# Patient Record
Sex: Male | Born: 1985 | Race: White | Hispanic: No | Marital: Married | Smoking: Never smoker
Health system: Southern US, Community
[De-identification: ages and names within clinical notes are randomized; demographics above are authoritative.]

---
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
## 2009-07-13 ENCOUNTER — Encounter: Payer: Self-pay | Admitting: Family Medicine

## 2009-07-13 DIAGNOSIS — R079 Chest pain, unspecified: Secondary | ICD-10-CM

## 2009-07-14 ENCOUNTER — Encounter: Payer: Self-pay | Admitting: Family Medicine

## 2009-07-14 ENCOUNTER — Ambulatory Visit: Payer: Self-pay | Admitting: Family Medicine

## 2009-07-14 ENCOUNTER — Ambulatory Visit (HOSPITAL_COMMUNITY): Admission: RE | Admit: 2009-07-14 | Discharge: 2009-07-14 | Payer: Self-pay | Admitting: Family Medicine

## 2009-07-14 DIAGNOSIS — E669 Obesity, unspecified: Secondary | ICD-10-CM

## 2009-07-14 DIAGNOSIS — F17200 Nicotine dependence, unspecified, uncomplicated: Secondary | ICD-10-CM

## 2009-07-14 DIAGNOSIS — R0602 Shortness of breath: Secondary | ICD-10-CM

## 2009-07-14 LAB — CONVERTED CEMR LAB
Albumin: 4.3 g/dL (ref 3.5–5.2)
Alkaline Phosphatase: 89 units/L (ref 39–117)
BUN: 9 mg/dL (ref 6–23)
Creatinine, Ser: 0.92 mg/dL (ref 0.40–1.50)
Glucose, Bld: 93 mg/dL (ref 70–99)
HDL: 37 mg/dL — ABNORMAL LOW (ref >39)
LDL Cholesterol: 120 mg/dL — ABNORMAL HIGH (ref 0–99)
Total Bilirubin: 0.2 mg/dL — ABNORMAL LOW (ref 0.3–1.2)
Total CHOL/HDL Ratio: 5.2
Triglycerides: 171 mg/dL — ABNORMAL HIGH (ref <150)
VLDL: 34 mg/dL (ref 0–40)

## 2009-07-16 ENCOUNTER — Encounter: Payer: Self-pay | Admitting: Family Medicine

## 2009-07-22 ENCOUNTER — Ambulatory Visit (HOSPITAL_COMMUNITY): Admission: RE | Admit: 2009-07-22 | Discharge: 2009-07-22 | Payer: Self-pay | Admitting: Family Medicine

## 2009-07-22 ENCOUNTER — Encounter: Payer: Self-pay | Admitting: Family Medicine

## 2009-07-30 ENCOUNTER — Ambulatory Visit: Payer: Self-pay | Admitting: Family Medicine

## 2009-08-03 ENCOUNTER — Encounter: Payer: Self-pay | Admitting: *Deleted

## 2009-12-06 ENCOUNTER — Encounter: Payer: Self-pay | Admitting: Family Medicine

## 2010-02-12 ENCOUNTER — Emergency Department (HOSPITAL_COMMUNITY)
Admission: EM | Admit: 2010-02-12 | Discharge: 2010-02-12 | Payer: Self-pay | Source: Home / Self Care | Admitting: Emergency Medicine

## 2010-03-22 NOTE — Letter (Signed)
Summary: Generic Letter  [facility]  [address]   [place], [place] [address]   Phone: [phone]  Fax: [phone]    08/03/2009  LAMBROS CERRO [address] [place], [place]  [address]  Dear Mr. CROCHET,      I have been unable to contact you by phone. Your doctor, Dr. Constance Goltz,  has requested we schedule an appointment for you with a cardiologist . An appointment has been scheduled for August 18, 2009 at 11:00 AM with Dr. Deborah Chalk of [facility]iology . The address is [address], [address]  phone # [phone]. If this time is not convenient, please call their office to reschedule.     Thank you. If you have any questions you can call me at [phone].      Sincerely,   Theresia Lo RN  Appended Document: Generic Letter Called home number no answer Will send another letter

## 2010-03-22 NOTE — Letter (Signed)
Summary: FU cardiology [facility]  [address]   [place], [place] [address]   Phone: [phone]  Fax: [phone]    12/06/2009  Scott Frey [address] [place], [place]  [address]  Dear [name],  This letter is a follow up on your visit with Dr Constance Goltz for chest pain.  We made an appointment for you with a cardiologist.   Please call us and let us know how you are and if you saw the cardiologist.  If you have not you should come in for an office visit to see [place].  This is very important since you could have a problem with your heart.   Sincerely,   Pearlean Brownie MD  Appended Document: FU cardiology referra mailed

## 2010-03-22 NOTE — Letter (Signed)
Summary: Lipid Letter  [facility]  [address]   [place], [place] [address]   Phone: [phone]  Fax: [phone]    07/14/2009  Ellington Greenslade [address] [place], [place]  [address]  Dear Jomarie Longs:  We have carefully reviewed your last lipid profile from 07/14/2009 and the results are noted below with a summary of recommendations for lipid management.    Cholesterol:       191     Goal: <200   HDL "good" Cholesterol:   37     Goal: >40   LDL "bad" Cholesterol:   120     Goal: <160   Triglycerides:       171     Goal: <150  Following are the results of your recent test(s):    Electrolytes -- normal   Blood Sugar -- normal   Kidney Function -- normal   Liver Function -- normal  Please call my office if you have any questions.  Regards, Madlyn Frankel. Constance Goltz, MD  Appended Document: Lipid Letter mailed.
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
## 2010-03-22 NOTE — Miscellaneous (Signed)
Summary: Health History Form     Past History:  Past Surgical History: Negative.   Current Medications (verified): 1)  None  Allergies (verified): No Known Drug Allergies   Family History: Negative for cancer and heart disease in 1st degree relatives. Positive for depresion, anxiety attacks Father alive, age 23 Mother alive, age 30  Social History: Lives in [place] with wife Jacksen Isip, b [place]) and daughter Edem Tiegs, b 2011).  Enjoys his baby and dog (pit bull).  Plays video games and enjoys yardwork.  College education.  Unemployed.  Smokes cigarettes.  No EtOH or illicit drug use.

## 2010-03-22 NOTE — Assessment & Plan Note (Signed)
Summary: F/U CHEST PAIN/KH   Vital Signs:  Patient profile:   25 year old male Height:      70 inches Weight:      262 pounds BMI:     37.73 Temp:     99.0 degrees F oral Pulse rate:   88 / minute BP sitting:   139 / 83  (left arm) Cuff size:   large  Vitals Entered By: Tessie Fass CMA (July 30, 2009 1:59 PM) CC: F/U Chest pain Is Patient Diabetic? No Pain Assessment Patient in pain? no        Primary Care Provider:  Madlyn Frankel. Constance Goltz, MD  CC:  F/U Chest pain.  History of Present Illness: 25 yo male here to follow up 2 issues:  CHEST PAIN (ICD-786.50) Awakes approximately every other day with chest soreness, that resolves spontaneously.  Denies current pain.  Formerly had some episodes of chest pain with exertion, but last episode was in October.  Not associated with dyspnea.  Had episodes of what sounds like presyncope as a young teenager, but none in the past 9-10 years.  EKG at last visit showed NSR with possible RBB.  2D echo showed mildly dilated LEFT atrium and RIGHT ventricle.  Normal LEVF.  DYSPNEA (ICD-786.05) Not associated temporally with chest pain.  Denies current dyspnea.  Last dyspnea was in December.  Clears quickly with quick breath.  Have referred to pharmacy clinic for PFTs but patient has had to reschedule that.  Continues to smoke.  Habits & Providers  Alcohol-Tobacco-Diet     Tobacco Status: current     Tobacco Counseling: to quit use of tobacco products     Cigarette Packs/Day: 1.0  Current Medications (verified): 1)  None  Allergies (verified): No Known Drug Allergies  Physical Exam  Additional Exam:  VITALS:  Reviewed, normal GEN: Alert & oriented, no acute distress CARDIO: Regular rate and rhythm, no murmurs/rubs/gallops, 2+ bilateral radial pulses RESP: Clear to auscultation, normal work of breathing, no retractions/accessory muscle use    Impression & Recommendations:  Problem # 1:  CHEST PAIN (ICD-786.50) Assessment  Unchanged  Unclear etiology.  May not be cardiac in nature, but given mild dilatation of 2 chambers and RBB will refer to cardiology for second opinion.  Orders: [facility]- Est Level  3 ([address]) Cardiology Referral (Cardiology)  Problem # 2:  DYSPNEA (ICD-786.05) Assessment: Improved  Question if this is asthma or COPD related.  Asymptomatic for several months now.  Given smoking history, have referred to pharmacy clinic for PFTs.  Orders: [facility]- Est Level  3 ([address])  Patient Instructions: 1)  Please schedule an appointment with Dr. Raymondo Band for lung function tests in the next 1-2 weeks.  You can do this at our front desk. 2)  For your chest pain, I have referred you to a cardiologist--we'll call with appointment. 3)  Please schedule a follow-up appointment with your new doctor in 1 month. 4)  Go immediately to the emergency room if your chest pain gets worse or changes in any way.

## 2010-05-02 LAB — DIFFERENTIAL
Basophils Absolute: 0 K/uL (ref 0.0–0.1)
Basophils Relative: 0 % (ref 0–1)
Eosinophils Absolute: 0.4 K/uL (ref 0.0–0.7)
Eosinophils Relative: 4 % (ref 0–5)
Lymphocytes Relative: 39 % (ref 12–46)
Lymphs Abs: 4.2 K/uL — ABNORMAL HIGH (ref 0.7–4.0)
Monocytes Absolute: 0.8 K/uL (ref 0.1–1.0)
Monocytes Relative: 7 % (ref 3–12)
Neutro Abs: 5.4 K/uL (ref 1.7–7.7)
Neutrophils Relative %: 50 % (ref 43–77)

## 2010-05-02 LAB — BASIC METABOLIC PANEL WITH GFR
BUN: 7 mg/dL (ref 6–23)
CO2: 23 meq/L (ref 19–32)
Calcium: 9 mg/dL (ref 8.4–10.5)
Chloride: 107 meq/L (ref 96–112)
Creatinine, Ser: 0.95 mg/dL (ref 0.4–1.5)
GFR calc Af Amer: >60 mL/min (ref >60)
GFR calc non Af Amer: >60 mL/min (ref >60)
Glucose, Bld: 101 mg/dL — ABNORMAL HIGH (ref 70–99)
Potassium: 3.8 meq/L (ref 3.5–5.1)
Sodium: 139 meq/L (ref 135–145)

## 2010-05-02 LAB — POCT CARDIAC MARKERS: Myoglobin, poc: 101 ng/mL (ref 12–200)

## 2010-05-02 LAB — URINALYSIS, ROUTINE W REFLEX MICROSCOPIC
Ketones, ur: NEGATIVE mg/dL
Nitrite: NEGATIVE
Protein, ur: NEGATIVE mg/dL
Urobilinogen, UA: 0.2 mg/dL (ref 0.0–1.0)
pH: 6.5 (ref 5.0–8.0)

## 2010-05-02 LAB — RAPID URINE DRUG SCREEN, HOSP PERFORMED: Benzodiazepines: NOT DETECTED

## 2010-05-02 LAB — CBC
MCV: 90 fL (ref 78.0–100.0)
Platelets: 261 10*3/uL (ref 150–400)
RDW: 13.1 % (ref 11.5–15.5)
WBC: 10.7 10*3/uL — ABNORMAL HIGH (ref 4.0–10.5)

## 2019-07-16 ENCOUNTER — Other Ambulatory Visit: Payer: Self-pay

## 2019-07-16 ENCOUNTER — Encounter (HOSPITAL_COMMUNITY): Payer: Self-pay | Admitting: Emergency Medicine

## 2019-07-16 ENCOUNTER — Emergency Department (HOSPITAL_COMMUNITY)
Admission: EM | Admit: 2019-07-16 | Discharge: 2019-07-16 | Disposition: A | Discharge status: Home / Self Care | Payer: Self-pay | Attending: Emergency Medicine | Admitting: Emergency Medicine

## 2019-07-16 ENCOUNTER — Emergency Department (HOSPITAL_COMMUNITY): Payer: Self-pay

## 2019-07-16 DIAGNOSIS — W3400XA Accidental discharge from unspecified firearms or gun, initial encounter: Secondary | ICD-10-CM | POA: Insufficient documentation

## 2019-07-16 DIAGNOSIS — Y939 Activity, unspecified: Secondary | ICD-10-CM | POA: Insufficient documentation

## 2019-07-16 DIAGNOSIS — M79601 Pain in right arm: Secondary | ICD-10-CM

## 2019-07-16 DIAGNOSIS — Y999 Unspecified external cause status: Secondary | ICD-10-CM | POA: Insufficient documentation

## 2019-07-16 DIAGNOSIS — Z20822 Contact with and (suspected) exposure to covid-19: Secondary | ICD-10-CM | POA: Insufficient documentation

## 2019-07-16 DIAGNOSIS — Z23 Encounter for immunization: Secondary | ICD-10-CM | POA: Insufficient documentation

## 2019-07-16 DIAGNOSIS — T1490XA Injury, unspecified, initial encounter: Secondary | ICD-10-CM

## 2019-07-16 DIAGNOSIS — R208 Other disturbances of skin sensation: Secondary | ICD-10-CM | POA: Insufficient documentation

## 2019-07-16 DIAGNOSIS — Y92003 Bedroom of unspecified non-institutional (private) residence as the place of occurrence of the external cause: Secondary | ICD-10-CM | POA: Insufficient documentation

## 2019-07-16 DIAGNOSIS — S12500B Unspecified displaced fracture of sixth cervical vertebra, initial encounter for open fracture: Secondary | ICD-10-CM | POA: Insufficient documentation

## 2019-07-16 LAB — COMPREHENSIVE METABOLIC PANEL
ALT: 20 U/L (ref 0–44)
AST: 19 U/L (ref 15–41)
Albumin: 3.7 g/dL (ref 3.5–5.0)
Alkaline Phosphatase: 65 U/L (ref 38–126)
Anion gap: 9 (ref 5–15)
BUN: 7 mg/dL (ref 6–20)
CO2: 22 mmol/L (ref 22–32)
Calcium: 8.6 mg/dL — ABNORMAL LOW (ref 8.9–10.3)
Chloride: 107 mmol/L (ref 98–111)
Creatinine, Ser: 0.99 mg/dL (ref 0.61–1.24)
GFR calc Af Amer: >60 mL/min (ref >60)
GFR calc non Af Amer: >60 mL/min (ref >60)
Glucose, Bld: 115 mg/dL — ABNORMAL HIGH (ref 70–99)
Potassium: 3.9 mmol/L (ref 3.5–5.1)
Sodium: 138 mmol/L (ref 135–145)
Total Bilirubin: 0.4 mg/dL (ref 0.3–1.2)
Total Protein: 6.3 g/dL — ABNORMAL LOW (ref 6.5–8.1)

## 2019-07-16 LAB — I-STAT CHEM 8, ED
BUN: 9 mg/dL (ref 6–20)
Calcium, Ion: 1.1 mmol/L — ABNORMAL LOW (ref 1.15–1.40)
Chloride: 106 mmol/L (ref 98–111)
Creatinine, Ser: 1 mg/dL (ref 0.61–1.24)
Glucose, Bld: 110 mg/dL — ABNORMAL HIGH (ref 70–99)
HCT: 42 % (ref 39.0–52.0)
Hemoglobin: 14.3 g/dL (ref 13.0–17.0)
Potassium: 4.1 mmol/L (ref 3.5–5.1)
Sodium: 139 mmol/L (ref 135–145)
TCO2: 25 mmol/L (ref 22–32)

## 2019-07-16 LAB — SAMPLE TO BLOOD BANK

## 2019-07-16 LAB — CBC
HCT: 43.8 % (ref 39.0–52.0)
Hemoglobin: 14.7 g/dL (ref 13.0–17.0)
MCH: 30.8 pg (ref 26.0–34.0)
MCHC: 33.6 g/dL (ref 30.0–36.0)
MCV: 91.8 fL (ref 80.0–100.0)
Platelets: 265 10*3/uL (ref 150–400)
RBC: 4.77 MIL/uL (ref 4.22–5.81)
RDW: 13.2 % (ref 11.5–15.5)
WBC: 10.8 10*3/uL — ABNORMAL HIGH (ref 4.0–10.5)
nRBC: 0 % (ref 0.0–0.2)

## 2019-07-16 LAB — SARS CORONAVIRUS 2 BY RT PCR (HOSPITAL ORDER, PERFORMED IN ~~LOC~~ HOSPITAL LAB): SARS Coronavirus 2: NEGATIVE

## 2019-07-16 LAB — PROTIME-INR
INR: 1.1 (ref 0.8–1.2)
Prothrombin Time: 13.3 seconds (ref 11.4–15.2)

## 2019-07-16 LAB — ETHANOL: Alcohol, Ethyl (B): <10 mg/dL (ref <10)

## 2019-07-16 MED ORDER — HYDROMORPHONE HCL 1 MG/ML IJ SOLN
1.0000 mg | Freq: Once | INTRAMUSCULAR | Status: AC
  Administered 2019-07-16: 1 mg via INTRAVENOUS
  Filled 2019-07-16: qty 1

## 2019-07-16 MED ORDER — METHOCARBAMOL 500 MG PO TABS
500.0000 mg | ORAL_TABLET | Freq: Two times a day (BID) | ORAL | 0 refills | Status: AC
Start: 2019-07-16 — End: ?

## 2019-07-16 MED ORDER — KETAMINE HCL 50 MG/5ML IJ SOSY
0.3000 mg/kg | PREFILLED_SYRINGE | Freq: Once | INTRAMUSCULAR | Status: AC
  Administered 2019-07-16: 39 mg via INTRAVENOUS
  Filled 2019-07-16: qty 5

## 2019-07-16 MED ORDER — GABAPENTIN 400 MG PO CAPS
600.0000 mg | ORAL_CAPSULE | Freq: Once | ORAL | Status: AC
  Administered 2019-07-16: 600 mg via ORAL
  Filled 2019-07-16: qty 2

## 2019-07-16 MED ORDER — SODIUM CHLORIDE 0.9 % IV SOLN
INTRAVENOUS | Status: AC | PRN
  Administered 2019-07-16: 125 mL/h via INTRAVENOUS

## 2019-07-16 MED ORDER — IBUPROFEN 600 MG PO TABS
600.0000 mg | ORAL_TABLET | Freq: Four times a day (QID) | ORAL | 0 refills | Status: AC | PRN
Start: 2019-07-16 — End: ?

## 2019-07-16 MED ORDER — LORAZEPAM 2 MG/ML IJ SOLN
1.0000 mg | Freq: Once | INTRAMUSCULAR | Status: AC
  Administered 2019-07-16: 1 mg via INTRAVENOUS
  Filled 2019-07-16: qty 1

## 2019-07-16 MED ORDER — GABAPENTIN 300 MG PO CAPS
300.0000 mg | ORAL_CAPSULE | Freq: Two times a day (BID) | ORAL | 0 refills | Status: AC
Start: 2019-07-16 — End: 2019-07-26

## 2019-07-16 MED ORDER — ONDANSETRON HCL 4 MG/2ML IJ SOLN
INTRAMUSCULAR | Status: AC
  Filled 2019-07-16: qty 2

## 2019-07-16 MED ORDER — HYDROMORPHONE HCL 1 MG/ML IJ SOLN
INTRAMUSCULAR | Status: AC
  Administered 2019-07-16: 2 mg
  Filled 2019-07-16: qty 2

## 2019-07-16 MED ORDER — HYDROMORPHONE HCL 1 MG/ML IJ SOLN: 0.5000 mg | Freq: Once | INTRAMUSCULAR | Status: DC

## 2019-07-16 MED ORDER — IOHEXOL 350 MG/ML SOLN
75.0000 mL | Freq: Once | INTRAVENOUS | Status: AC | PRN
  Administered 2019-07-16: 75 mL via INTRAVENOUS

## 2019-07-16 MED ORDER — HYDROCODONE-ACETAMINOPHEN 5-325 MG PO TABS: 1.0000 | ORAL_TABLET | Freq: Two times a day (BID) | ORAL | 0 refills | Status: AC | PRN

## 2019-07-16 MED ORDER — ACETAMINOPHEN ER 650 MG PO TBCR
650.0000 mg | EXTENDED_RELEASE_TABLET | Freq: Three times a day (TID) | ORAL | 0 refills | Status: AC | PRN
Start: 2019-07-16 — End: ?

## 2019-07-16 MED ORDER — TETANUS-DIPHTH-ACELL PERTUSSIS 5-2.5-18.5 LF-MCG/0.5 IM SUSP
0.5000 mL | Freq: Once | INTRAMUSCULAR | Status: AC
  Administered 2019-07-16: 0.5 mL via INTRAMUSCULAR
  Filled 2019-07-16: qty 0.5

## 2019-07-16 MED ORDER — DEXAMETHASONE SODIUM PHOSPHATE 10 MG/ML IJ SOLN
10.0000 mg | Freq: Once | INTRAMUSCULAR | Status: AC
  Administered 2019-07-16: 10 mg via INTRAVENOUS
  Filled 2019-07-16: qty 1
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
## 2019-07-16 NOTE — ED Notes (Signed)
Transported to CT scan
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
## 2019-07-16 NOTE — ED Notes (Signed)
Pt transported to MRI on 2L [place] 

## 2019-07-16 NOTE — ED Notes (Signed)
X-ray at bedside

## 2021-04-17 ENCOUNTER — Other Ambulatory Visit: Payer: Self-pay

## 2021-04-17 ENCOUNTER — Encounter (HOSPITAL_COMMUNITY): Payer: Self-pay | Admitting: *Deleted

## 2021-04-17 ENCOUNTER — Emergency Department (HOSPITAL_COMMUNITY): Payer: Self-pay

## 2021-04-17 ENCOUNTER — Emergency Department (HOSPITAL_COMMUNITY)
Admission: EM | Admit: 2021-04-17 | Discharge: 2021-04-17 | Disposition: A | Discharge status: Home / Self Care | Payer: Self-pay | Attending: Emergency Medicine | Admitting: Emergency Medicine

## 2021-04-17 DIAGNOSIS — T402X1A Poisoning by other opioids, accidental (unintentional), initial encounter: Secondary | ICD-10-CM | POA: Insufficient documentation

## 2021-04-17 DIAGNOSIS — Z79899 Other long term (current) drug therapy: Secondary | ICD-10-CM | POA: Insufficient documentation

## 2021-04-17 MED ORDER — NALOXONE HCL 4 MG/0.1ML NA LIQD
1.0000 | Freq: Once | NASAL | Status: AC
  Administered 2021-04-17: 1 via NASAL
  Filled 2021-04-17: qty 4

## 2021-04-17 NOTE — ED Notes (Signed)
Pt resting but arouses easily to verbal stimuli

## 2021-04-17 NOTE — Discharge Instructions (Signed)
There is help if you need it.  Please do not use dirty needles, this could cause you a severe infection to your skin, heart or spinal cord.  This could kill you or leave you permanently disabled.  There was a recent study done at [facility] that showed that the risk of death for someone that had unintentionally overdosed on narcotics was as high as 15% in the next year.  This is much higher than most every other medical condition.  [place] Solution to the Opioid Problem (GCSTOP) Fixed; mobile; peer-based Chase Holleman [phone] [email or web address] Fixed site exchange at [employer], Wednesdays (2-5pm) and Thursdays (4-8pm). [address] [place], [place] [address] Call or text to arrange mobile and peer exchange, Mondays (1-4pm) and Fridays (4-7pm). Serving [place] [email or web address]  Suboxone clinic: [facility] [address] [place], [address]  [facility] [address] [place], [address]  [facility] [address] [place] [address]  
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
## 2021-12-21 DEATH — deceased

## 2023-04-22 IMAGING — DX DG CHEST 1V PORT
1 series · 1 of 1 positions shown · non-contrast
Comparison: Chest radiograph dated 07/16/2019.

CLINICAL DATA: Status post CPR.

EXAM:
PORTABLE CHEST 1 VIEW

[chest ap]
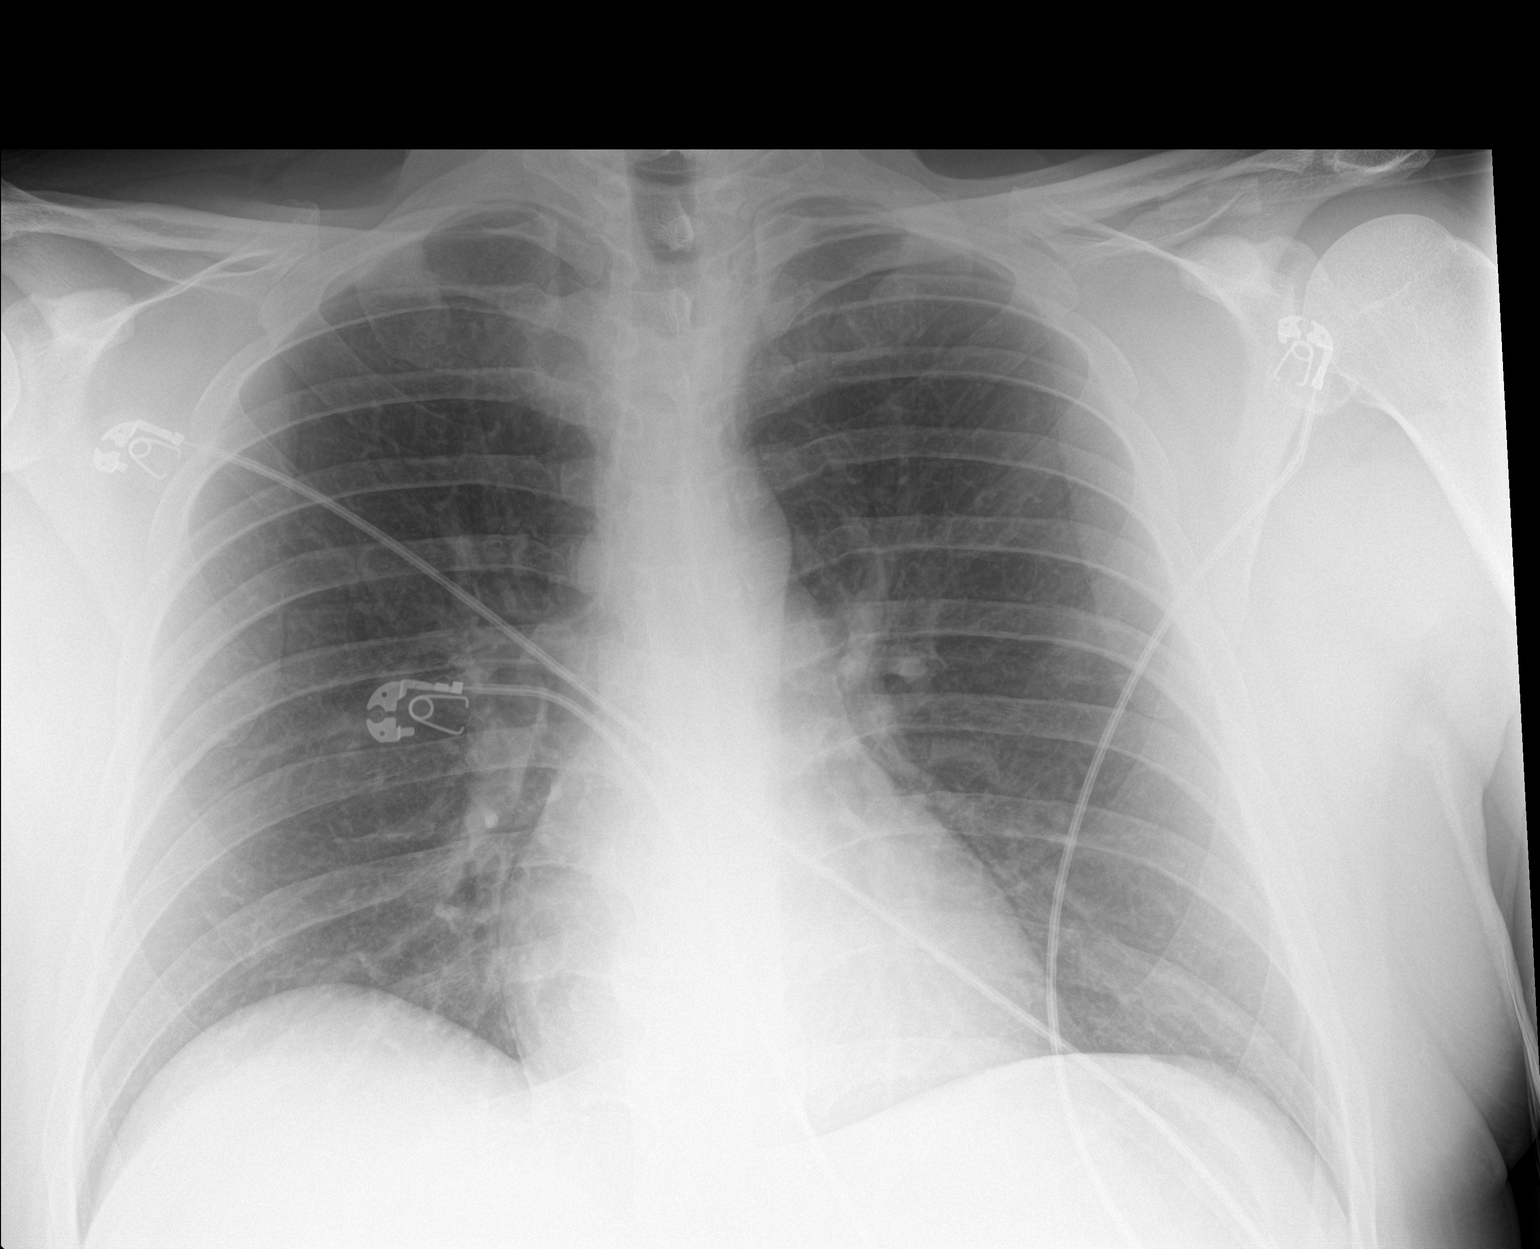

[1 of 1 positions shown; findings below may reference images not displayed]

FINDINGS: The heart size and mediastinal contours are within normal limits.
Both lungs are clear. The visualized skeletal structures are
unremarkable.
IMPRESSION: No active disease.
